# Patient Record
Sex: Female | Born: 2000
Health system: Southern US, Community
[De-identification: ages and names within clinical notes are randomized; demographics above are authoritative.]

## PROBLEM LIST (undated history)

## (undated) DIAGNOSIS — Z8669 Personal history of other diseases of the nervous system and sense organs: Secondary | ICD-10-CM

## (undated) DIAGNOSIS — G919 Hydrocephalus, unspecified: Secondary | ICD-10-CM

## (undated) DIAGNOSIS — J392 Other diseases of pharynx: Secondary | ICD-10-CM

## (undated) DIAGNOSIS — I639 Cerebral infarction, unspecified: Secondary | ICD-10-CM

## (undated) DIAGNOSIS — L309 Dermatitis, unspecified: Secondary | ICD-10-CM

## (undated) DIAGNOSIS — G44209 Tension-type headache, unspecified, not intractable: Secondary | ICD-10-CM

## (undated) DIAGNOSIS — H47019 Ischemic optic neuropathy, unspecified eye: Secondary | ICD-10-CM

## (undated) HISTORY — PX: WISDOM TOOTH EXTRACTION: SHX21

## (undated) HISTORY — PX: TYMPANOPLASTY: SHX33

---
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
## 2020-01-12 DIAGNOSIS — Z20828 Contact with and (suspected) exposure to other viral communicable diseases: Secondary | ICD-10-CM | POA: Diagnosis not present

## 2020-02-12 DIAGNOSIS — J029 Acute pharyngitis, unspecified: Secondary | ICD-10-CM | POA: Diagnosis not present

## 2020-03-29 DIAGNOSIS — Z6829 Body mass index (BMI) 29.0-29.9, adult: Secondary | ICD-10-CM | POA: Diagnosis not present

## 2020-03-29 DIAGNOSIS — Z113 Encounter for screening for infections with a predominantly sexual mode of transmission: Secondary | ICD-10-CM | POA: Diagnosis not present

## 2020-03-29 DIAGNOSIS — Z309 Encounter for contraceptive management, unspecified: Secondary | ICD-10-CM | POA: Diagnosis not present

## 2020-03-29 DIAGNOSIS — Z01419 Encounter for gynecological examination (general) (routine) without abnormal findings: Secondary | ICD-10-CM | POA: Diagnosis not present

## 2020-05-01 DIAGNOSIS — R05 Cough: Secondary | ICD-10-CM | POA: Diagnosis not present

## 2020-05-01 DIAGNOSIS — H6691 Otitis media, unspecified, right ear: Secondary | ICD-10-CM | POA: Diagnosis not present

## 2020-09-17 DIAGNOSIS — Z13220 Encounter for screening for lipoid disorders: Secondary | ICD-10-CM | POA: Diagnosis not present

## 2020-09-17 DIAGNOSIS — Z0000 Encounter for general adult medical examination without abnormal findings: Secondary | ICD-10-CM | POA: Diagnosis not present

## 2020-09-17 DIAGNOSIS — R5383 Other fatigue: Secondary | ICD-10-CM | POA: Diagnosis not present

## 2020-09-17 DIAGNOSIS — Z1159 Encounter for screening for other viral diseases: Secondary | ICD-10-CM | POA: Diagnosis not present

## 2020-09-17 DIAGNOSIS — R0602 Shortness of breath: Secondary | ICD-10-CM | POA: Diagnosis not present

## 2020-09-17 DIAGNOSIS — Z1331 Encounter for screening for depression: Secondary | ICD-10-CM | POA: Diagnosis not present

## 2020-09-17 DIAGNOSIS — N915 Oligomenorrhea, unspecified: Secondary | ICD-10-CM | POA: Diagnosis not present

## 2020-09-17 DIAGNOSIS — E559 Vitamin D deficiency, unspecified: Secondary | ICD-10-CM | POA: Diagnosis not present

## 2020-09-17 DIAGNOSIS — Z131 Encounter for screening for diabetes mellitus: Secondary | ICD-10-CM | POA: Diagnosis not present

## 2020-12-05 DIAGNOSIS — N390 Urinary tract infection, site not specified: Secondary | ICD-10-CM | POA: Diagnosis not present

## 2020-12-05 DIAGNOSIS — R300 Dysuria: Secondary | ICD-10-CM | POA: Diagnosis not present

## 2020-12-09 DIAGNOSIS — N390 Urinary tract infection, site not specified: Secondary | ICD-10-CM | POA: Diagnosis not present

## 2020-12-09 DIAGNOSIS — R300 Dysuria: Secondary | ICD-10-CM | POA: Diagnosis not present

## 2021-05-14 DIAGNOSIS — Z6827 Body mass index (BMI) 27.0-27.9, adult: Secondary | ICD-10-CM | POA: Diagnosis not present

## 2021-05-14 DIAGNOSIS — Z01419 Encounter for gynecological examination (general) (routine) without abnormal findings: Secondary | ICD-10-CM | POA: Diagnosis not present

## 2021-10-14 DIAGNOSIS — M7989 Other specified soft tissue disorders: Secondary | ICD-10-CM | POA: Diagnosis not present

## 2021-11-19 DIAGNOSIS — R1310 Dysphagia, unspecified: Secondary | ICD-10-CM | POA: Diagnosis not present

## 2021-11-19 DIAGNOSIS — J029 Acute pharyngitis, unspecified: Secondary | ICD-10-CM | POA: Diagnosis not present

## 2021-11-19 DIAGNOSIS — J020 Streptococcal pharyngitis: Secondary | ICD-10-CM | POA: Diagnosis not present

## 2021-12-11 DIAGNOSIS — M542 Cervicalgia: Secondary | ICD-10-CM | POA: Diagnosis not present

## 2021-12-11 DIAGNOSIS — M9902 Segmental and somatic dysfunction of thoracic region: Secondary | ICD-10-CM | POA: Diagnosis not present

## 2021-12-11 DIAGNOSIS — M546 Pain in thoracic spine: Secondary | ICD-10-CM | POA: Diagnosis not present

## 2021-12-11 DIAGNOSIS — M9901 Segmental and somatic dysfunction of cervical region: Secondary | ICD-10-CM | POA: Diagnosis not present

## 2022-05-27 DIAGNOSIS — M9902 Segmental and somatic dysfunction of thoracic region: Secondary | ICD-10-CM | POA: Diagnosis not present

## 2022-05-27 DIAGNOSIS — M546 Pain in thoracic spine: Secondary | ICD-10-CM | POA: Diagnosis not present

## 2022-05-27 DIAGNOSIS — M7912 Myalgia of auxiliary muscles, head and neck: Secondary | ICD-10-CM | POA: Diagnosis not present

## 2022-05-27 DIAGNOSIS — M9901 Segmental and somatic dysfunction of cervical region: Secondary | ICD-10-CM | POA: Diagnosis not present

## 2022-06-03 DIAGNOSIS — Z113 Encounter for screening for infections with a predominantly sexual mode of transmission: Secondary | ICD-10-CM | POA: Diagnosis not present

## 2022-06-03 DIAGNOSIS — Z6827 Body mass index (BMI) 27.0-27.9, adult: Secondary | ICD-10-CM | POA: Diagnosis not present

## 2022-06-03 DIAGNOSIS — Z01419 Encounter for gynecological examination (general) (routine) without abnormal findings: Secondary | ICD-10-CM | POA: Diagnosis not present

## 2022-06-03 DIAGNOSIS — Z124 Encounter for screening for malignant neoplasm of cervix: Secondary | ICD-10-CM | POA: Diagnosis not present

## 2022-06-09 DIAGNOSIS — L301 Dyshidrosis [pompholyx]: Secondary | ICD-10-CM | POA: Diagnosis not present

## 2022-06-09 DIAGNOSIS — L209 Atopic dermatitis, unspecified: Secondary | ICD-10-CM | POA: Diagnosis not present

## 2022-07-01 DIAGNOSIS — J3089 Other allergic rhinitis: Secondary | ICD-10-CM | POA: Diagnosis not present

## 2022-07-01 DIAGNOSIS — T783XXS Angioneurotic edema, sequela: Secondary | ICD-10-CM | POA: Diagnosis not present

## 2022-07-01 DIAGNOSIS — J302 Other seasonal allergic rhinitis: Secondary | ICD-10-CM | POA: Diagnosis not present

## 2022-07-01 DIAGNOSIS — L209 Atopic dermatitis, unspecified: Secondary | ICD-10-CM | POA: Diagnosis not present

## 2022-07-01 DIAGNOSIS — T783XXD Angioneurotic edema, subsequent encounter: Secondary | ICD-10-CM | POA: Diagnosis not present

## 2022-07-14 ENCOUNTER — Other Ambulatory Visit (HOSPITAL_BASED_OUTPATIENT_CLINIC_OR_DEPARTMENT_OTHER): Payer: Self-pay

## 2022-07-14 ENCOUNTER — Emergency Department (HOSPITAL_BASED_OUTPATIENT_CLINIC_OR_DEPARTMENT_OTHER): Payer: BC Managed Care – PPO

## 2022-07-14 ENCOUNTER — Other Ambulatory Visit: Payer: Self-pay

## 2022-07-14 ENCOUNTER — Encounter (HOSPITAL_BASED_OUTPATIENT_CLINIC_OR_DEPARTMENT_OTHER): Payer: Self-pay | Admitting: Emergency Medicine

## 2022-07-14 ENCOUNTER — Emergency Department (HOSPITAL_BASED_OUTPATIENT_CLINIC_OR_DEPARTMENT_OTHER)
Admission: EM | Admit: 2022-07-14 | Discharge: 2022-07-14 | Disposition: A | Discharge status: Home / Self Care | Payer: BC Managed Care – PPO | Attending: Emergency Medicine | Admitting: Emergency Medicine

## 2022-07-14 DIAGNOSIS — R1013 Epigastric pain: Secondary | ICD-10-CM | POA: Diagnosis not present

## 2022-07-14 DIAGNOSIS — R1032 Left lower quadrant pain: Secondary | ICD-10-CM | POA: Diagnosis not present

## 2022-07-14 DIAGNOSIS — N12 Tubulo-interstitial nephritis, not specified as acute or chronic: Secondary | ICD-10-CM | POA: Diagnosis not present

## 2022-07-14 DIAGNOSIS — Z8673 Personal history of transient ischemic attack (TIA), and cerebral infarction without residual deficits: Secondary | ICD-10-CM | POA: Diagnosis not present

## 2022-07-14 DIAGNOSIS — R000 Tachycardia, unspecified: Secondary | ICD-10-CM | POA: Insufficient documentation

## 2022-07-14 DIAGNOSIS — M549 Dorsalgia, unspecified: Secondary | ICD-10-CM | POA: Diagnosis not present

## 2022-07-14 DIAGNOSIS — R109 Unspecified abdominal pain: Secondary | ICD-10-CM | POA: Diagnosis not present

## 2022-07-14 LAB — CBC WITH DIFFERENTIAL/PLATELET
Abs Immature Granulocytes: 0.02 10*3/uL (ref 0.00–0.07)
Basophils Absolute: 0 10*3/uL (ref 0.0–0.1)
Basophils Relative: 0 %
Eosinophils Absolute: 0 10*3/uL (ref 0.0–0.5)
Eosinophils Relative: 0 %
HCT: 40.8 % (ref 36.0–46.0)
Hemoglobin: 13.8 g/dL (ref 12.0–15.0)
Immature Granulocytes: 0 %
Lymphocytes Relative: 9 %
Lymphs Abs: 0.4 10*3/uL — ABNORMAL LOW (ref 0.7–4.0)
MCH: 27.9 pg (ref 26.0–34.0)
MCHC: 33.8 g/dL (ref 30.0–36.0)
MCV: 82.6 fL (ref 80.0–100.0)
Monocytes Absolute: 0.3 10*3/uL (ref 0.1–1.0)
Monocytes Relative: 6 %
Neutro Abs: 4.1 10*3/uL (ref 1.7–7.7)
Neutrophils Relative %: 85 %
Platelets: 247 10*3/uL (ref 150–400)
RBC: 4.94 MIL/uL (ref 3.87–5.11)
RDW: 11.8 % (ref 11.5–15.5)
WBC: 4.8 10*3/uL (ref 4.0–10.5)
nRBC: 0 % (ref 0.0–0.2)

## 2022-07-14 LAB — COMPREHENSIVE METABOLIC PANEL
ALT: 7 U/L (ref 0–44)
AST: 11 U/L — ABNORMAL LOW (ref 15–41)
Albumin: 3.8 g/dL (ref 3.5–5.0)
Alkaline Phosphatase: 49 U/L (ref 38–126)
Anion gap: 12 (ref 5–15)
BUN: 14 mg/dL (ref 6–20)
CO2: 20 mmol/L — ABNORMAL LOW (ref 22–32)
Calcium: 8.9 mg/dL (ref 8.9–10.3)
Chloride: 107 mmol/L (ref 98–111)
Creatinine, Ser: 0.87 mg/dL (ref 0.44–1.00)
GFR, Estimated: >60 mL/min (ref >60)
Glucose, Bld: 96 mg/dL (ref 70–99)
Potassium: 3.5 mmol/L (ref 3.5–5.1)
Sodium: 139 mmol/L (ref 135–145)
Total Bilirubin: 0.5 mg/dL (ref 0.3–1.2)
Total Protein: 6.3 g/dL — ABNORMAL LOW (ref 6.5–8.1)

## 2022-07-14 LAB — URINALYSIS, ROUTINE W REFLEX MICROSCOPIC
Bilirubin Urine: NEGATIVE
Glucose, UA: NEGATIVE mg/dL
Hgb urine dipstick: NEGATIVE
Leukocytes,Ua: NEGATIVE
Nitrite: NEGATIVE
Protein, ur: 30 mg/dL — AB
Specific Gravity, Urine: 1.032 — ABNORMAL HIGH (ref 1.005–1.030)
pH: 6 (ref 5.0–8.0)

## 2022-07-14 LAB — LIPASE, BLOOD: Lipase: 17 U/L (ref 11–51)

## 2022-07-14 LAB — PREGNANCY, URINE: Preg Test, Ur: NEGATIVE

## 2022-07-14 MED ORDER — CEFPODOXIME PROXETIL 200 MG PO TABS
200.0000 mg | ORAL_TABLET | Freq: Two times a day (BID) | ORAL | 0 refills | Status: DC
  Filled 2022-07-14: qty 20, 10d supply, fill #0

## 2022-07-14 MED ORDER — ONDANSETRON HCL 4 MG/2ML IJ SOLN
4.0000 mg | Freq: Once | INTRAMUSCULAR | Status: AC
  Administered 2022-07-14: 4 mg via INTRAVENOUS
  Filled 2022-07-14: qty 2

## 2022-07-14 MED ORDER — FENTANYL CITRATE PF 50 MCG/ML IJ SOSY
50.0000 ug | PREFILLED_SYRINGE | Freq: Once | INTRAMUSCULAR | Status: AC
  Administered 2022-07-14: 50 ug via INTRAVENOUS
  Filled 2022-07-14: qty 1

## 2022-07-14 MED ORDER — SODIUM CHLORIDE 0.9 % IV SOLN
1.0000 g | Freq: Once | INTRAVENOUS | Status: AC
  Administered 2022-07-14: 1 g via INTRAVENOUS
  Filled 2022-07-14: qty 10

## 2022-07-14 MED ORDER — CIPROFLOXACIN HCL 500 MG PO TABS
500.0000 mg | ORAL_TABLET | Freq: Two times a day (BID) | ORAL | 0 refills | Status: AC
  Filled 2022-07-14: qty 14, 7d supply, fill #0

## 2022-07-14 MED ORDER — SODIUM CHLORIDE 0.9 % IV SOLN: INTRAVENOUS | Status: DC

## 2022-07-14 MED ORDER — SODIUM CHLORIDE 0.9 % IV BOLUS
1000.0000 mL | Freq: Once | INTRAVENOUS | Status: AC
  Administered 2022-07-14: 1000 mL via INTRAVENOUS

## 2022-07-14 NOTE — ED Notes (Signed)
Patient transported to Ultrasound 

## 2022-07-14 NOTE — ED Provider Notes (Signed)
[facility] EMERGENCY DEPT Provider Note   CSN: [id number] Arrival date & time: 07/14/22  3976     History  Chief Complaint  Patient presents with   Back Pain    Michele Ewing is a 21 y.o. female.   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  The patient states that symptoms have been ongoing for the past 24 hours.  She endorses increased frequency, no specific dysuria.  She denies any fever or chills.  She denies any abdominal pain or pelvic pain.  She states that she just started her menstrual cycle which is normally regular and just started bleeding.  She is sexually active.  She denies any abnormal vaginal discharge or pelvic pain.  She endorses left-sided flank pain which is sharp and shooting with intermittent waves of nausea.  No vomiting.  She states that she is concerned primarily for a kidney stone.  She is not sure if she has had hematuria.  She denies any midline back pain.  She endorses primarily left-sided flank pain.  She denies any recent falls or trauma.  She denies any radicular symptoms.  She denies any numbness, weakness, urinary or fecal incontinence.  Home Medications Prior to Admission medications   Medication Sig Start Date End Date Taking? Authorizing Provider  ciprofloxacin (CIPRO) 500 MG tablet Take 1 tablet (500 mg total) by mouth 2 (two) times daily for 7 days. 07/14/22 07/21/22 Yes Ernie Avena, MD      Allergies    Patient has no known allergies.    Review of Systems   Review of Systems  Genitourinary:  Positive for flank pain and frequency.  All other systems reviewed and are negative.   Physical Exam Updated Vital Signs BP  107/69 (BP Location: Right Arm)   Pulse 74   Temp 98.1 F (36.7 C) (Oral)   Resp 16   Ht 5\' 6"  (1.676 m)   Wt 77.1 kg   LMP 07/13/2022 (Exact Date)   SpO2 99%   BMI 27.44 kg/m  Physical Exam Vitals and nursing note reviewed.  Constitutional:      General: She is not in acute distress.    Appearance: She is well-developed.  HENT:     Head: Normocephalic and atraumatic.  Eyes:     Conjunctiva/sclera: Conjunctivae normal.  Cardiovascular:     Rate and Rhythm: Normal rate and regular rhythm.     Heart sounds: No murmur heard. Pulmonary:     Effort: Pulmonary effort is normal. No respiratory distress.     Breath sounds: Normal breath sounds.  Abdominal:     Palpations: Abdomen is soft.     Tenderness: There is abdominal tenderness in the epigastric area. There is left CVA tenderness. There is no right CVA tenderness, guarding or rebound.  Genitourinary:    Comments: Patient declined pelvic exam Musculoskeletal:        General: No swelling.     Cervical back: Neck supple.  Skin:    General: Skin is warm and dry.     Capillary Refill: Capillary refill takes less than 2 seconds.  Neurological:     Mental Status: She is alert.  Psychiatric:        Mood and  Affect: Mood normal.     ED Results / Procedures / Treatments   Labs (all labs ordered are listed, but only abnormal results are displayed) Labs Reviewed  COMPREHENSIVE METABOLIC PANEL - Abnormal; Notable for the following components:      Result Value   CO2 20 (*)    Total Protein 6.3 (*)    AST 11 (*)    All other components within normal limits  CBC WITH DIFFERENTIAL/PLATELET - Abnormal; Notable for the following components:   Lymphs Abs 0.4 (*)    All other components within normal limits  URINALYSIS, ROUTINE W REFLEX MICROSCOPIC - Abnormal; Notable for the following components:   Specific Gravity, Urine 1.032 (*)    Ketones, ur TRACE (*)    Protein, ur 30 (*)    Bacteria, UA MANY (*)    All other components  within normal limits  URINE CULTURE  LIPASE, BLOOD  PREGNANCY, URINE    EKG None  Radiology US PELVIC COMPLETE W TRANSVAGINAL AND TORSION R/O  Result Date: 07/14/2022 CLINICAL DATA:  Left lower quadrant and left flank pain EXAM: TRANSABDOMINAL ULTRASOUND OF PELVIS DOPPLER ULTRASOUND OF OVARIES TECHNIQUE: Transabdominal ultrasound examination of the pelvis was performed including evaluation of the uterus, ovaries, adnexal regions, and pelvic cul-de-sac. Color and duplex Doppler ultrasound was utilized to evaluate blood flow to the ovaries. COMPARISON:  None Available. FINDINGS: Uterus Measurements: 7.3 x 3.5 x 4.6 cm = volume: 62 mL. Anteflexed. No fibroids or other mass visualized. Endometrium Thickness: 4 mm.  No focal abnormality visualized. Right ovary Measurements: 2.8 x 1.4 x 1.5 cm = volume: 3.1 mL. Normal appearance/no adnexal mass. Left ovary Measurements: 2.6 x 1.6 x 2.4 cm = volume: 5.2 mL. Normal appearance/no adnexal mass. Pulsed Doppler evaluation demonstrates normal low-resistance arterial and venous waveforms in both ovaries. Other: Small volume free fluid in the pelvis, likely physiologic. IMPRESSION: No evidence of ovarian torsion. Electronically Signed   By: Allegra Lai M.D.   On: 07/14/2022 14:16   CT Renal Stone Study  Result Date: 07/14/2022 CLINICAL DATA:  Flank pain, kidney stone suspected EXAM: CT ABDOMEN AND PELVIS WITHOUT CONTRAST TECHNIQUE: Multidetector CT imaging of the abdomen and pelvis was performed following the standard protocol without IV contrast. RADIATION DOSE REDUCTION: This exam was performed according to the departmental dose-optimization program which includes automated exposure control, adjustment of the mA and/or kV according to patient size and/or use of iterative reconstruction technique. COMPARISON:  None Available. FINDINGS: Lower chest: No acute abnormality. Hepatobiliary: No focal liver abnormality is seen. No gallstones, gallbladder wall  thickening, or biliary dilatation. Pancreas: Unremarkable. Spleen: Unremarkable. Adrenals/Urinary Tract: Adrenals, kidneys, and partially distended bladder are unremarkable. Stomach/Bowel: Stomach is within normal limits. Bowel is normal in caliber. Probable normal appendix is identified. There are no inflammatory changes in the right lower quadrant. Vascular/Lymphatic: No significant vascular abnormality on this noncontrast study. No enlarged nodes. Reproductive: Uterus and adnexa are unremarkable. A menstrual cup is present. Other: No free fluid.  Abdominal wall is unremarkable. Musculoskeletal: No acute osseous abnormality. IMPRESSION: No acute abnormality. Electronically Signed   By: Guadlupe Spanish M.D.   On: 07/14/2022 11:01    Procedures Procedures    Medications Ordered in ED Medications  sodium chloride 0.9 % bolus 1,000 mL (0 mLs Intravenous Stopped 07/14/22 1106)    And  0.9 %  sodium chloride infusion (0 mLs Intravenous Stopped 07/14/22 1459)  fentaNYL (SUBLIMAZE) injection 50 mcg (50 mcg Intravenous Given 07/14/22 0932)  ondansetron (ZOFRAN) injection  4 mg (4 mg Intravenous Given 07/14/22 0929)  fentaNYL (SUBLIMAZE) injection 50 mcg (50 mcg Intravenous Given 07/14/22 1249)  cefTRIAXone (ROCEPHIN) 1 g in sodium chloride 0.9 % 100 mL IVPB (0 g Intravenous Stopped 07/14/22 1459)    ED Course/ Medical Decision Making/ A&P Clinical Course as of 07/14/22 1758  Tue Jul 14, 2022  1449 Bacteria, UA(!): MANY [JL]    Clinical Course User Index [JL] Ernie Avena, MD                           Medical Decision Making Amount and/or Complexity of Data Reviewed Labs: ordered. Decision-making details documented in ED Course. Radiology: ordered.  Risk Prescription drug management.    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  The patient states that symptoms have been ongoing for the past 24 hours.  She endorses increased frequency, no specific dysuria.  She denies any fever or chills.  She denies any abdominal pain or pelvic pain.  She states that she just started her menstrual cycle which is normally regular and just started bleeding.  She is sexually active.  She denies any abnormal vaginal discharge or pelvic pain.  She endorses left-sided flank pain which is sharp and shooting with intermittent waves of nausea.  No vomiting.  She states that she is concerned primarily for a kidney stone.  She is not sure if she has had hematuria.  She denies any midline back pain.  She endorses primarily left-sided flank pain.  She denies any recent falls or trauma.  She denies any radicular symptoms.  She denies any numbness, weakness, urinary or fecal incontinence.  On arrival, the patient was vitally stable, mildly tachycardic P1 13, otherwise unremarkable vitals.  Sinus tachycardia noted on cardiac telemetry.  Physical exam significant for left-sided CVA tenderness, very minimal epigastric tenderness to palpation.  No rebound or guarding.  No suprapubic tenderness.   Differential Diagnosis: Primary differential includes nephrolithiasis versus UTI/pyelonephritis.  Additionally considered ruptured ovarian cyst, ovarian torsion, ectopic pregnancy, appendicitis, Bowel Obstruction, AAA, ACS, pneumonia, pneumothorax, Pancreatitis, Cholecystitis, Shingles, Perforated Bowel or Ulcer, Diverticulosis/itis, Ischemic Mesentery, Inflammatory Bowel Disease, Strangulated/Incarcerated Hernia, gastritis, PUD.   Lab results include: Urinalysis with trace ketones, free protein, many bacteria, negative nitrites and leukocytes, negative for hematuria, 0-5 WBCs.  CMP with a non-anion gap acidosis with a Bicarb of 20.  CBC without a  leukocytosis or anemia.  Urine pregnancy negative, lipase normal.  Imaging results include: CT abdomen pelvis renal stone protocol without acute abnormality with no evidence of nephrolithiasis.  Course of tx has consisted of: Administered an IV fluid bolus, IV fentanyl.  Thought process: Patient symptoms of increased urinary frequency and flank pain are concerning for potential urinary tract infection and developing pyelonephritis, however the patient has no leukocytosis, her urinalysis is not convincing for urinary tract infection with no increased white cells present.  She does have many bacteria however.  Patient is still endorsing pain on repeat assessment with left-sided CVA tenderness.  Very minimal to no pelvic tenderness on exam externally.  Of note, the pt declined a pelvic exam. The limitations and lack of diagnosis of potential STI such as gonorrhea or chlamydia, lack of diagnosis  of bacterial vaginosis, difficulty to appreciate adnexal tenderness without pelvic exam.  We will proceed with ultrasound of the pelvis with Doppler to evaluate for potential torsion versus other acute pelvic pathology.  Pelvic ultrasound was performed which revealed no evidence of ovarian torsion or other acute pelvic abnormality.  The patient was administered IV Rocephin to cover for developing pyelonephritis.  Overall tolerating oral intake, able to attempt outpatient management.  Return precautions provided.  Presentation more consistent with UTI/pyelonephritis.   Final Clinical Impression(s) / ED Diagnoses Final diagnoses:  Pyelonephritis    Rx / DC Orders ED Discharge Orders          Ordered    cefpodoxime (VANTIN) 200 MG tablet  2 times daily,   Status:  Discontinued        07/14/22 1448    ciprofloxacin (CIPRO) 500 MG tablet  2 times daily        07/14/22 1459              Ernie Avena, MD 07/14/22 1758

## 2022-07-14 NOTE — Discharge Instructions (Addendum)
Your CT imaging and ultrasound imaging was negative for acute abnormalities.  You have likely developing kidney infection from an ascending urinary tract infection.  We will start you on antibiotics and have you follow-up with your primary care physician.  Return to the emergency department for worsening pain. Recommend Tylenol and Ibuprofen for pain control

## 2022-07-14 NOTE — ED Triage Notes (Signed)
Pt from home c/o of mid to lower back pain for the past 24 hours. The also c/o of nausea but no episodes of emesis.

## 2022-07-15 LAB — URINE CULTURE: Culture: NO GROWTH

## 2022-07-16 ENCOUNTER — Ambulatory Visit: Payer: Self-pay | Admitting: *Deleted

## 2022-07-16 DIAGNOSIS — N10 Acute tubulo-interstitial nephritis: Secondary | ICD-10-CM | POA: Diagnosis not present

## 2022-07-16 DIAGNOSIS — R300 Dysuria: Secondary | ICD-10-CM | POA: Diagnosis not present

## 2022-07-16 NOTE — Telephone Encounter (Signed)
Summary: Pt diagnosed with kidney infection and now experiencing burning feeling in lower back   Pt stated she went to our [facility] location the other day and she was diagnosed with a kidney infection. Pt reports now she has started experiencing a burning sensation in her lower back. Pt requests call back to advise.     Reason for Disposition  [1] Taking antibiotic > 72 hours (3 days) for UTI AND [2] painful urination or frequency is SAME (unchanged, not better)  Answer Assessment - Initial Assessment Questions 1. MAIN SYMPTOM: "What is the main symptom you are concerned about?" (e.g., painful urination, urine frequency)     Lower back pain- burning sensation 2. BETTER-SAME-WORSE: "Are you getting better, staying the same, or getting worse compared to how you felt at your last visit to the doctor (most recent medical visit)?"     Pain same 3. PAIN: "How bad is the pain?"  (e.g., Scale 1-10; mild, moderate, or severe)   - MILD (1-3): complains slightly about urination hurting   - MODERATE (4-7): interferes with normal activities     - SEVERE (8-10): excruciating, unwilling or unable to urinate because of the pain      Moderate/severe 4. FEVER: "Do you have a fever?" If Yes, ask: "What is it, how was it measured, and when did it start?"     no 5. OTHER SYMPTOMS: "Do you have any other symptoms?" (e.g., blood in the urine, flank pain, vaginal discharge)     Frequency, burning yesterday 6. DIAGNOSIS: "When was the UTI diagnosed?" "By whom?" "Was it a kidney infection, bladder infection or both?"     07/14/22- ED 7. ANTIBIOTIC: "What antibiotic(s) are you taking?" "How many times per day?"     Cipro 500 mg twice daily- 7 days 8. ANTIBIOTIC - START DATE: "When did you start taking the antibiotic?"     8/1  Protocols used: Urinary Tract Infection on Antibiotic Follow-up Call - [facility]

## 2022-07-16 NOTE — Telephone Encounter (Signed)
  Chief Complaint: symptoms not improving after 2 days antibiotics Symptoms: back pain, frequency Frequency: started treatment 07/14/22 Pertinent Negatives: Patient denies fever Disposition: [] ED /[x] Urgent Care (no appt availability in office) / [] Appointment(In office/virtual)/ []  [facility] [facility]/ [] Home Care/ [] Refused Recommended Disposition /[] [facility]/ []  Follow-up with PCP Additional Notes: Patient advised be seen with 24 hours- contact PCP/UC

## 2022-10-14 DIAGNOSIS — L309 Dermatitis, unspecified: Secondary | ICD-10-CM | POA: Diagnosis not present

## 2022-11-11 DIAGNOSIS — L309 Dermatitis, unspecified: Secondary | ICD-10-CM | POA: Diagnosis not present
